# Patient Record
Sex: Female | Born: 1995 | Race: Black or African American | Hispanic: No | Marital: Single | Smoking: Never smoker
Health system: Southern US, Community
[De-identification: ages and names within clinical notes are randomized; demographics above are authoritative.]

---

## 2014-06-10 ENCOUNTER — Encounter: Payer: Self-pay | Admitting: Family Medicine

## 2014-06-10 ENCOUNTER — Ambulatory Visit (INDEPENDENT_AMBULATORY_CARE_PROVIDER_SITE_OTHER): Payer: 59 | Admitting: Family Medicine

## 2014-06-10 ENCOUNTER — Ambulatory Visit (INDEPENDENT_AMBULATORY_CARE_PROVIDER_SITE_OTHER): Payer: 59

## 2014-06-10 VITALS — BP 120/70 | HR 81 | Temp 98.2°F | Resp 16 | Ht 62.0 in | Wt 138.0 lb

## 2014-06-10 DIAGNOSIS — R079 Chest pain, unspecified: Secondary | ICD-10-CM

## 2014-06-10 DIAGNOSIS — D649 Anemia, unspecified: Secondary | ICD-10-CM

## 2014-06-10 LAB — POCT CBC
Granulocyte percent: 50.2 %G (ref 37–80)
HEMATOCRIT: 36.8 % — AB (ref 37.7–47.9)
Hemoglobin: 11.7 g/dL — AB (ref 12.2–16.2)
LYMPH, POC: 2.7 (ref 0.6–3.4)
MCH, POC: 28 pg (ref 27–31.2)
MCHC: 31.7 g/dL — AB (ref 31.8–35.4)
MCV: 88.3 fL (ref 80–97)
MID (cbc): 0.3 (ref 0–0.9)
MPV: 7.1 fL (ref 0–99.8)
POC GRANULOCYTE: 3 (ref 2–6.9)
POC LYMPH PERCENT: 44.2 %L (ref 10–50)
POC MID %: 5.6 %M (ref 0–12)
Platelet Count, POC: 450 10*3/uL — AB (ref 142–424)
RBC: 4.16 M/uL (ref 4.04–5.48)
RDW, POC: 13.4 %
WBC: 6 10*3/uL (ref 4.6–10.2)

## 2014-06-10 MED ORDER — GI COCKTAIL ~~LOC~~
30.0000 mL | Freq: Once | ORAL | Status: AC
Start: 2014-06-10 — End: 2014-06-10
  Administered 2014-06-10: 30 mL via ORAL

## 2014-06-10 NOTE — Patient Instructions (Signed)

## 2014-06-10 NOTE — Progress Notes (Signed)
Chief Complaint:  Chief Complaint  Patient presents with  . Chest Pain    HPI: Heidi Griffith is a 18 y.o. 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 The chestp pain is desribed as sharp at times and throbbing, she has had the CP for the last 45 min but there was a period where she was CP free. She has had this in the past lasting for about5-10 minutes but she comes in today because she is owrried about that it is Therapist, art. She has no risk factors for heart diseases: denies HTN, DM, Hyperlipidemia, prior MI or premature hx of MI/arrhythymias. She deneis any HA, vision changes, SOB, palpitations, n/v/abd pain or numbness weakness or tingling. She has no asymmeteric weakness or swelling of her lower extremities, She did fly to [place] recently but nothing longer than 1 hour. She denies GERD sxs, no new exercises or excessive lifting, moving things  No past medical history on file. No past surgical history on file. History   Social History  . Marital Status: Single    Spouse Name: N/A    Number of Children: N/A  . Years of Education: N/A   Social History Main Topics  . Smoking status: Never Smoker   . Smokeless tobacco: None  . Alcohol Use: None  . Drug Use: None  . Sexual Activity: None   Other Topics Concern  . None   Social History Narrative  . None   No family history on file. Allergies no known allergies Prior to Admission medications   Medication Sig Start Date End Date Taking? Authorizing Provider  Norethindrone-Eth Estradiol (NORTREL 0.5/35, 28, PO) Take by mouth.   Yes Historical Provider, MD     ROS: The patient denies fevers, chills, night sweats, unintentional weight loss, palpitations, wheezing, dyspnea on exertion, nausea, vomiting, abdominal pain, dysuria, hematuria, melena,  numbness, weakness, or tingling.   All other systems have been reviewed and were otherwise negative with the exception of those mentioned in the HPI and as above.    PHYSICAL EXAM: Filed Vitals:   06/10/14 2046  BP: 120/70  Pulse: 81  Temp: 98.2 F (36.8 C)  Resp: 16   Filed Vitals:   06/10/14 2046  Height: 5\' 2"  (1.575 m)  Weight: 138 lb (62.596 kg)   Body mass index is 25.23 kg/(m^2).  General: Alert, no acute distress HEENT:  Normocephalic, atraumatic, oropharynx patent. EOMI, PERRLA. Tm nl Cardiovascular:  Regular rate and rhythm, no rubs murmurs or gallops.  No Carotid bruits, radial pulse intact. No pedal edema. , there is palpable pain of sternum  Respiratory: Clear to auscultation bilaterally.  No wheezes, rales, or rhonchi.  No cyanosis, no use of accessory musculature GI: No organomegaly, abdomen is soft and non-tender, positive bowel sounds.  No masses. Skin: No rashes. Neurologic: Facial musculature symmetric. Cn 2-12 grossly nl Psychiatric: Patient is appropriate throughout our interaction. Lymphatic: No cervical lymphadenopathy Musculoskeletal: Gait intact. 5/5 str ue and le , 2/2 dtr Neg HOman, n assymetric swelling of LE   LABS: Results for orders placed in visit on 06/10/14  POCT CBC      Result Value Ref Range   WBC 6.0  4.6 - 10.2 K/uL   Lymph, poc 2.7  0.6 - 3.4   POC LYMPH PERCENT 44.2  10 - 50 %L   MID (cbc) 0.3  0 - 0.9   POC MID % 5.6  0 - 12 %M   POC Granulocyte 3.0  2 - 6.9   Granulocyte percent 50.2  37 - 80 %G   RBC 4.16  4.04 - 5.48 M/uL   Hemoglobin 11.7 (*) 12.2 - 16.2 g/dL   HCT, POC 36.8 (*) 37.7 - 47.9 %   MCV 88.3  80 - 97 fL   MCH, POC 28.0  27 - 31.2 pg   MCHC 31.7 (*) 31.8 - 35.4 g/dL   RDW, POC 13.4     Platelet Count, POC 450 (*) 142 - 424 K/uL   MPV 7.1  0 - 99.8 fL     EKG/XRAY:   Primary read interpreted by Dr. Marin Comment at [facility]. Neg for acute cardiopulm process ekg nsr 86 bpm  ASSESSMENT/PLAN: Encounter Diagnoses    Name Primary?  . Chest pain, unspecified chest pain type Yes  . Anemia, unspecified anemia type    Atypical CP, ? GERD vs msk in etiology. Less likely cardiac or pulmonary LAbs pending She will be referred to cardiology if this continues Trial of GI cocktail given in office, she will take tums/zantac prn for this and see if this helps in the event this is GERD  Will get iron studies for minimal anemia, she states her periods are normal but sometimes she does have clots She has a minimally elevated platelet count, I have asked her to return in 1 month to recheck her cbc  F/u in 1 month for cbc labs or sooner prn PRecautions given to go to ER prn    Gross sideeffects, risk and benefits, and alternatives of medications d/w patient. Patient is aware that all medications have potential sideeffects and we are unable to predict every sideeffect or drug-drug interaction that may occur.  LE, Selz, DO 06/11/2014 6:49 AM

## 2014-06-11 ENCOUNTER — Encounter: Payer: Self-pay | Admitting: Family Medicine

## 2014-06-11 ENCOUNTER — Other Ambulatory Visit: Payer: Self-pay | Admitting: Family Medicine

## 2014-06-11 DIAGNOSIS — D75839 Thrombocytosis, unspecified: Secondary | ICD-10-CM

## 2014-06-11 DIAGNOSIS — D649 Anemia, unspecified: Secondary | ICD-10-CM

## 2014-06-11 DIAGNOSIS — D473 Essential (hemorrhagic) thrombocythemia: Secondary | ICD-10-CM

## 2014-06-11 LAB — COMPREHENSIVE METABOLIC PANEL
ALT: 12 U/L (ref 0–35)
AST: 17 U/L (ref 0–37)
Albumin: 3.9 g/dL (ref 3.5–5.2)
Alkaline Phosphatase: 57 U/L (ref 39–117)
BILIRUBIN TOTAL: 0.2 mg/dL (ref 0.2–1.2)
BUN: 14 mg/dL (ref 6–23)
CO2: 28 meq/L (ref 19–32)
Calcium: 9.4 mg/dL (ref 8.4–10.5)
Chloride: 104 mEq/L (ref 96–112)
Creat: 0.83 mg/dL (ref 0.50–1.10)
GLUCOSE: 97 mg/dL (ref 70–99)
Potassium: 4.5 mEq/L (ref 3.5–5.3)
Sodium: 138 mEq/L (ref 135–145)
Total Protein: 7.1 g/dL (ref 6.0–8.3)

## 2014-06-11 LAB — IBC PANEL
%SAT: 16 % — ABNORMAL LOW (ref 20–55)
TIBC: 392 ug/dL (ref 250–470)
UIBC: 330 ug/dL (ref 125–400)

## 2014-06-11 LAB — IRON: Iron: 62 ug/dL (ref 42–145)

## 2014-06-11 LAB — FERRITIN: Ferritin: 19 ng/mL (ref 10–291)

## 2014-06-19 ENCOUNTER — Encounter: Payer: Self-pay | Admitting: Family Medicine

## 2015-05-13 IMAGING — CR DG CHEST 2V
2 series · 2 of 2 positions shown · non-contrast
Comparison: None.

CLINICAL DATA: Chest pain.

EXAM:
CHEST  2 VIEW

[PA]
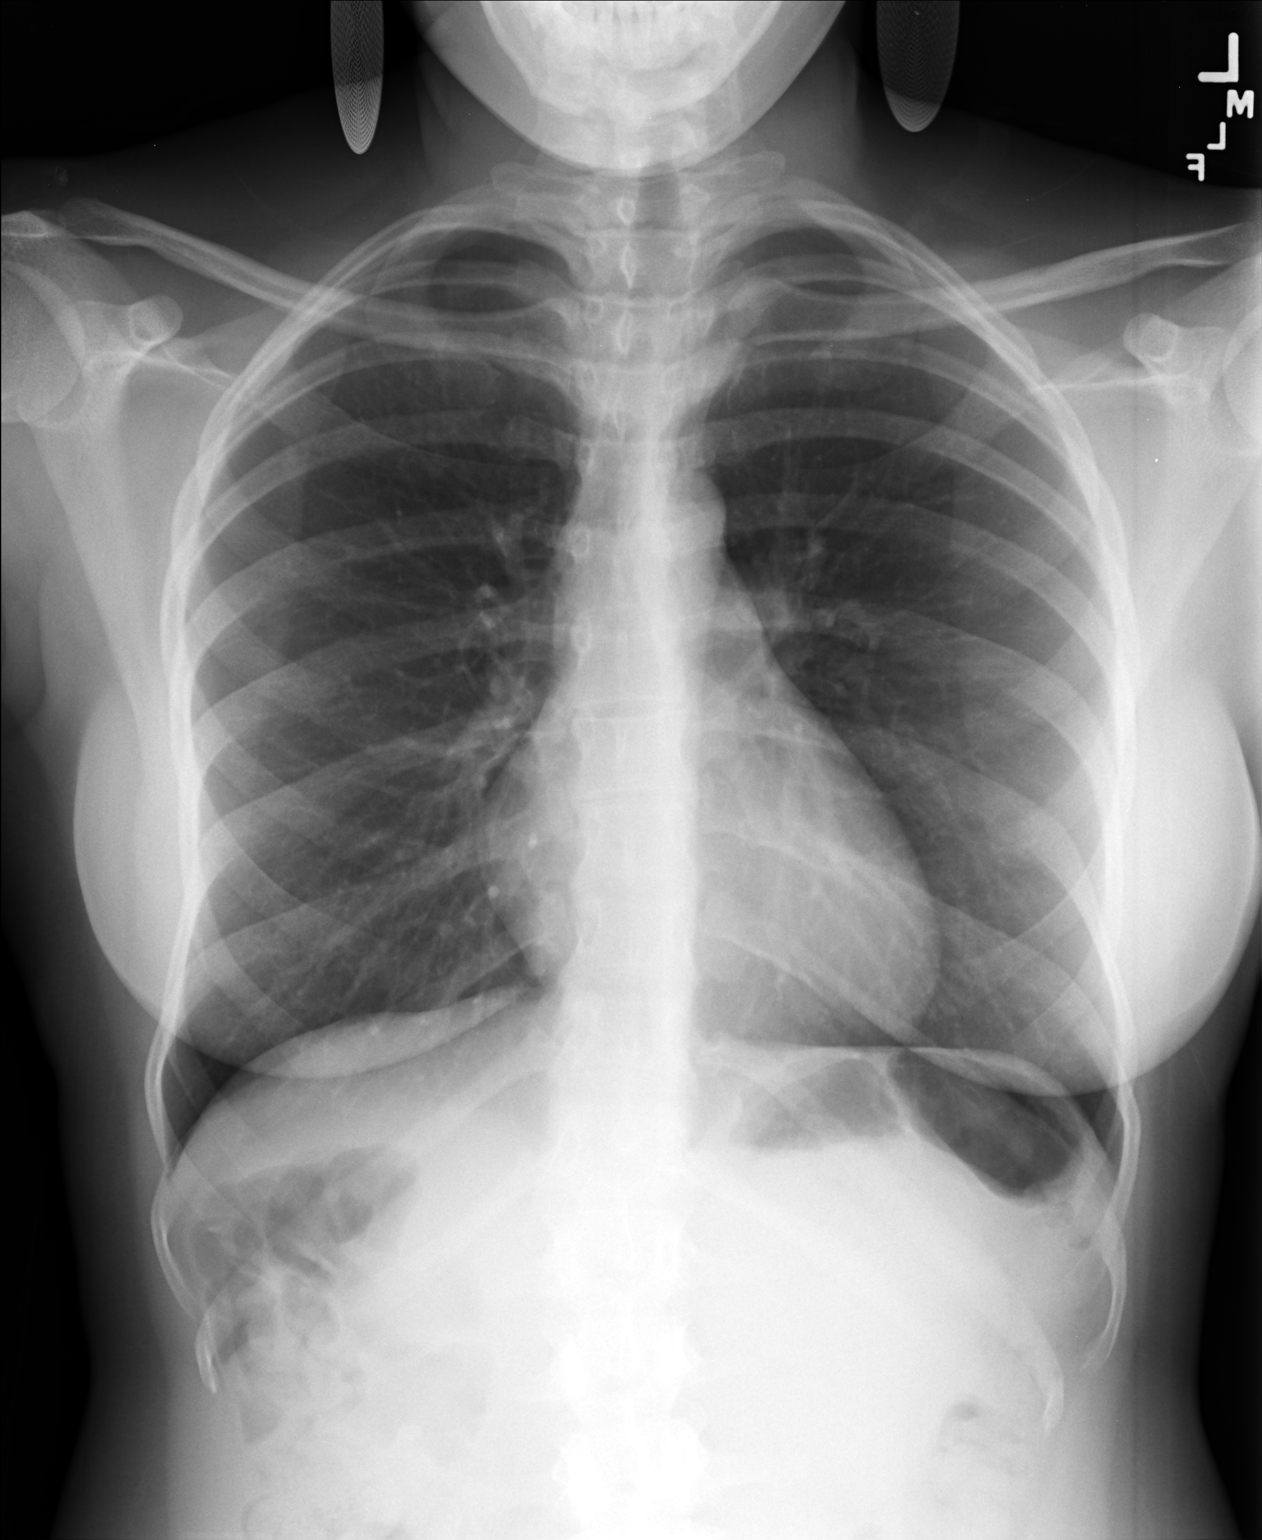

[lateral]
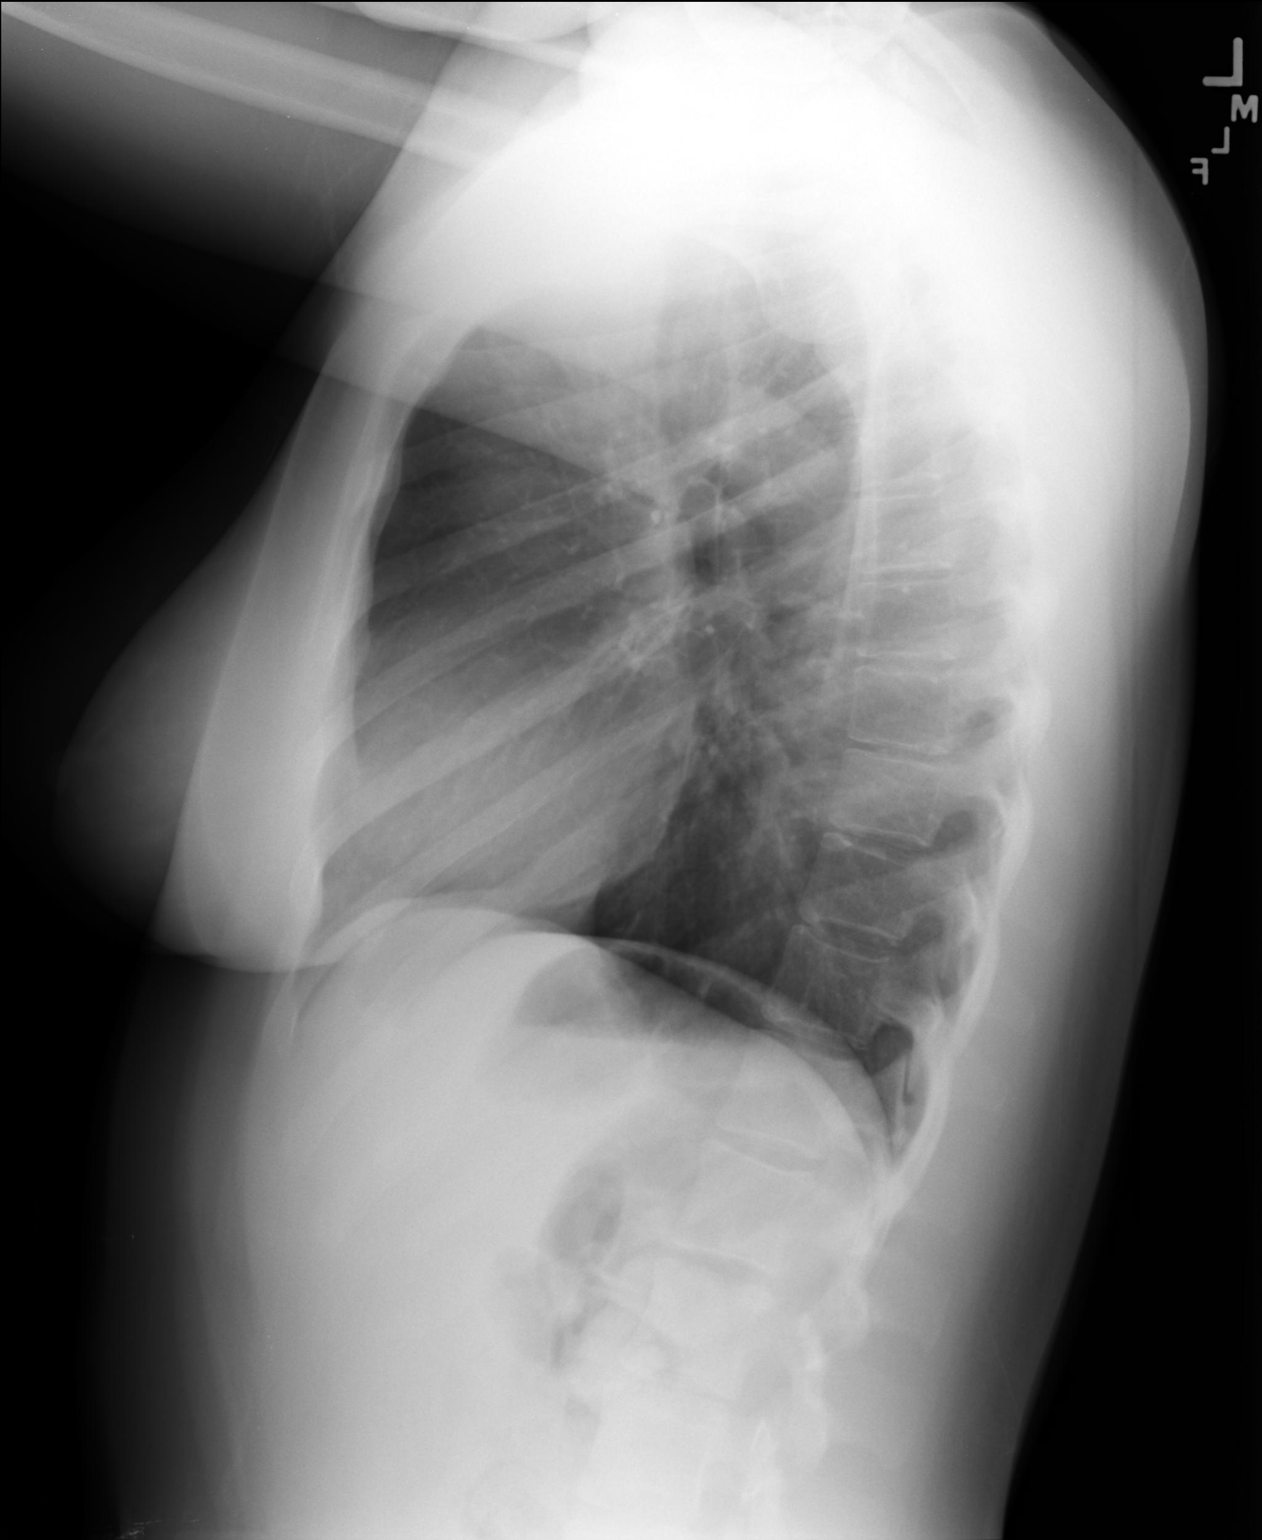

[2 of 2 positions shown; findings below may reference images not displayed]

FINDINGS: Heart size and mediastinal contours are within normal limits. Both
lungs are clear. Visualized skeletal structures are unremarkable.
IMPRESSION: Negative exam.

## 2019-05-29 ENCOUNTER — Other Ambulatory Visit (HOSPITAL_COMMUNITY)
Admission: RE | Admit: 2019-05-29 | Discharge: 2019-05-29 | Disposition: A | Discharge status: Home / Self Care | Payer: 59 | Source: Ambulatory Visit | Attending: Obstetrics and Gynecology | Admitting: Obstetrics and Gynecology

## 2019-05-29 ENCOUNTER — Other Ambulatory Visit: Payer: Self-pay | Admitting: Obstetrics and Gynecology

## 2019-05-29 DIAGNOSIS — Z124 Encounter for screening for malignant neoplasm of cervix: Secondary | ICD-10-CM | POA: Diagnosis present

## 2019-06-02 LAB — CYTOLOGY - PAP: Diagnosis: NEGATIVE

## 2019-12-25 ENCOUNTER — Ambulatory Visit: Payer: 59
